# Patient Record
Sex: Female | Born: 1985 | Race: Black or African American | Hispanic: No | Marital: Single | Smoking: Never smoker
Health system: Southern US, Community
[De-identification: ages and names within clinical notes are randomized; demographics above are authoritative.]

## PROBLEM LIST (undated history)

## (undated) DIAGNOSIS — T7840XA Allergy, unspecified, initial encounter: Secondary | ICD-10-CM

## (undated) HISTORY — DX: Allergy, unspecified, initial encounter: T78.40XA

---

## 2014-04-07 ENCOUNTER — Encounter (HOSPITAL_COMMUNITY): Payer: Self-pay

## 2014-04-07 ENCOUNTER — Emergency Department (HOSPITAL_COMMUNITY)
Admission: EM | Admit: 2014-04-07 | Discharge: 2014-04-07 | Disposition: A | Discharge status: Home / Self Care | Payer: 59 | Attending: Emergency Medicine | Admitting: Emergency Medicine

## 2014-04-07 ENCOUNTER — Emergency Department (HOSPITAL_COMMUNITY): Payer: 59

## 2014-04-07 DIAGNOSIS — Y9389 Activity, other specified: Secondary | ICD-10-CM | POA: Insufficient documentation

## 2014-04-07 DIAGNOSIS — Y998 Other external cause status: Secondary | ICD-10-CM | POA: Insufficient documentation

## 2014-04-07 DIAGNOSIS — Y9289 Other specified places as the place of occurrence of the external cause: Secondary | ICD-10-CM | POA: Insufficient documentation

## 2014-04-07 DIAGNOSIS — Z3202 Encounter for pregnancy test, result negative: Secondary | ICD-10-CM | POA: Insufficient documentation

## 2014-04-07 DIAGNOSIS — S29012A Strain of muscle and tendon of back wall of thorax, initial encounter: Secondary | ICD-10-CM | POA: Diagnosis not present

## 2014-04-07 DIAGNOSIS — M549 Dorsalgia, unspecified: Secondary | ICD-10-CM | POA: Diagnosis present

## 2014-04-07 DIAGNOSIS — X58XXXA Exposure to other specified factors, initial encounter: Secondary | ICD-10-CM | POA: Insufficient documentation

## 2014-04-07 DIAGNOSIS — M545 Low back pain: Secondary | ICD-10-CM | POA: Insufficient documentation

## 2014-04-07 DIAGNOSIS — R079 Chest pain, unspecified: Secondary | ICD-10-CM | POA: Insufficient documentation

## 2014-04-07 DIAGNOSIS — S39012A Strain of muscle, fascia and tendon of lower back, initial encounter: Secondary | ICD-10-CM

## 2014-04-07 LAB — URINALYSIS, ROUTINE W REFLEX MICROSCOPIC
Bilirubin Urine: NEGATIVE
GLUCOSE, UA: NEGATIVE mg/dL
Hgb urine dipstick: NEGATIVE
Ketones, ur: NEGATIVE mg/dL
Nitrite: NEGATIVE
Protein, ur: NEGATIVE mg/dL
SPECIFIC GRAVITY, URINE: 1.022 (ref 1.005–1.030)
Urobilinogen, UA: 1 mg/dL (ref 0.0–1.0)
pH: 6.5 (ref 5.0–8.0)

## 2014-04-07 LAB — CBC WITH DIFFERENTIAL/PLATELET
BASOS ABS: 0 10*3/uL (ref 0.0–0.1)
BASOS PCT: 0 % (ref 0–1)
Eosinophils Absolute: 0.4 10*3/uL (ref 0.0–0.7)
Eosinophils Relative: 5 % (ref 0–5)
HEMATOCRIT: 39.7 % (ref 36.0–46.0)
Hemoglobin: 12.4 g/dL (ref 12.0–15.0)
LYMPHS PCT: 38 % (ref 12–46)
Lymphs Abs: 2.8 10*3/uL (ref 0.7–4.0)
MCH: 26.4 pg (ref 26.0–34.0)
MCHC: 31.2 g/dL (ref 30.0–36.0)
MCV: 84.5 fL (ref 78.0–100.0)
Monocytes Absolute: 0.4 10*3/uL (ref 0.1–1.0)
Monocytes Relative: 5 % (ref 3–12)
NEUTROS PCT: 52 % (ref 43–77)
Neutro Abs: 3.9 10*3/uL (ref 1.7–7.7)
PLATELETS: 209 10*3/uL (ref 150–400)
RBC: 4.7 MIL/uL (ref 3.87–5.11)
RDW: 12.8 % (ref 11.5–15.5)
WBC: 7.5 10*3/uL (ref 4.0–10.5)

## 2014-04-07 LAB — I-STAT TROPONIN, ED: Troponin i, poc: 0 ng/mL (ref 0.00–0.08)

## 2014-04-07 LAB — COMPREHENSIVE METABOLIC PANEL
ALBUMIN: 4.4 g/dL (ref 3.5–5.2)
ALT: 25 U/L (ref 0–35)
AST: 23 U/L (ref 0–37)
Alkaline Phosphatase: 90 U/L (ref 39–117)
Anion gap: 11 (ref 5–15)
BILIRUBIN TOTAL: 0.2 mg/dL — AB (ref 0.3–1.2)
BUN: 10 mg/dL (ref 6–23)
CHLORIDE: 104 meq/L (ref 96–112)
CO2: 20 mmol/L (ref 19–32)
Calcium: 9.3 mg/dL (ref 8.4–10.5)
Creatinine, Ser: 0.7 mg/dL (ref 0.50–1.10)
GFR calc Af Amer: >90 mL/min (ref >90)
Glucose, Bld: 84 mg/dL (ref 70–99)
Potassium: 3.7 mmol/L (ref 3.5–5.1)
SODIUM: 135 mmol/L (ref 135–145)
Total Protein: 7.7 g/dL (ref 6.0–8.3)

## 2014-04-07 LAB — URINE MICROSCOPIC-ADD ON

## 2014-04-07 LAB — PREGNANCY, URINE: Preg Test, Ur: NEGATIVE

## 2014-04-07 LAB — D-DIMER, QUANTITATIVE: D-Dimer, Quant: <0.27 ug/mL-FEU (ref 0.00–0.48)

## 2014-04-07 MED ORDER — IBUPROFEN 800 MG PO TABS
800.0000 mg | ORAL_TABLET | Freq: Once | ORAL | Status: DC
  Filled 2014-04-07: qty 1

## 2014-04-07 NOTE — ED Provider Notes (Signed)
CSN: [id number]     Arrival date & time 04/07/14  [id number] History   First MD Initiated Contact with Patient 04/07/14 [phone]     Chief Complaint  Patient presents with  . Back Pain     (Consider location/radiation/quality/duration/timing/severity/associated sxs/prior Treatment) The history is provided by the patient.  Pamela Davidson is a 28 y.o. female here with L sided chest and back pain. 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 She began to have left-sided chest pain and back pain. It is worse when she takes a deep breath. Not worse with exertion. Denies any cough or fevers. Denies any flank pain. No history of PE or DVT and no leg swelling. She states that her grandmother died of heart disease at old age. No personal hx of CAD.    History reviewed. No pertinent past medical history. History reviewed. No pertinent past surgical history. No family history on file. History  Substance Use Topics  . Smoking status: Never Smoker   . Smokeless tobacco: Not on file  . Alcohol Use: No   OB History    No data available     Review of Systems  Cardiovascular: Positive for chest pain.  All other systems reviewed and are negative.     Allergies  Review of patient's allergies indicates no known allergies.  Home Medications   Prior to Admission medications   Not on File   BP 119/71 mmHg  Pulse 75  Temp(Src) 97.9 F (36.6 C) (Oral)  Resp 12  Ht 5\' 6"  (1.676 m)  Wt 146 lb (66.225 kg)  BMI 23.58 kg/m2  SpO2 100%  LMP 02/13/2014 Physical Exam  Constitutional: She is oriented to person, place, and time. She appears well-developed and well-nourished.  HENT:  Head: Normocephalic.  Mouth/Throat: Oropharynx is clear and moist.  Eyes: Conjunctivae and EOM are normal. Pupils are equal, round, and reactive to light.  Neck: Normal range of motion. Neck supple.  Cardiovascular: Normal rate, regular rhythm and normal heart sounds.   Pulmonary/Chest: Effort normal and breath sounds  normal. No respiratory distress. She has no wheezes. She has no rales.  Point tenderness L lower posterior ribs, no obvious deformity. No crackles   Abdominal: Soft. Bowel sounds are normal. She exhibits no distension. There is no tenderness. There is no rebound.  Musculoskeletal: Normal range of motion. She exhibits no edema or tenderness.  Neurological: She is alert and oriented to person, place, and time. No cranial nerve deficit. Coordination normal.  Skin: Skin is warm and dry.  Psychiatric: She has a normal mood and affect. Her behavior is normal. Judgment and thought content normal.  Nursing note and vitals reviewed.   ED Course  Procedures (including critical care time) Labs Review Labs Reviewed  COMPREHENSIVE METABOLIC PANEL - Abnormal; Notable for the following:    Total Bilirubin 0.2 (*)    All other components within normal limits  URINALYSIS, ROUTINE W REFLEX MICROSCOPIC - Abnormal; Notable for the following:    APPearance HAZY (*)    Leukocytes, UA SMALL (*)    All other components within normal limits  URINE MICROSCOPIC-ADD ON - Abnormal; Notable for the following:    Squamous Epithelial / LPF MANY (*)    Bacteria, UA MANY (*)    All other components within normal limits  URINE CULTURE  CBC WITH DIFFERENTIAL  D-DIMER, QUANTITATIVE  PREGNANCY, URINE  I-STAT TROPOININ, ED    Imaging Review Dg Chest 2 View  04/07/2014   CLINICAL DATA:  Sharp posterior  chest pain and shortness of breath.  EXAM: CHEST - 2 VIEW  COMPARISON:  None  FINDINGS: The heart size and mediastinal contours are within normal limits. There is no evidence of pulmonary edema, consolidation, pneumothorax, nodule or pleural fluid. The visualized skeletal structures are unremarkable.  IMPRESSION: No active disease.   Electronically Signed   By: Irish LackGlenn  Yamagata M.D.   On: 04/07/2014 07:51     EKG Interpretation   Date/Time:  Tuesday April 07 2014 06:59:57 EST Ventricular Rate:  75 PR Interval:   147 QRS Duration: 103 QT Interval:  381 QTC Calculation: 425 R Axis:   86 Text Interpretation:  Sinus rhythm Borderline Q waves in inferior leads No  previous ECGs available Confirmed by YAO  MD, DAVID ([id number]) on 04/07/2014  7:31:31 AM      MDM   Final diagnoses:  Chest pain   Pamela Davidson is a 28 y.o. female here with L sided chest pain. Consider muscle strain vs PE, less likely to be ACS. Low risk for PE so will get d-dimer. Will get labs and CXR.   8:46 AM D-dimer neg. Labs and CXR unremarkable. UA showed no blood. Likely muscle strain. Recommend prn motrin.    Richardean Canalavid H Yao, MD 04/07/14 [phone]

## 2014-04-07 NOTE — Discharge Instructions (Signed)
Take motrin 600 mg every 6 hrs for pain.  Avoid heavy lifting.   Follow up with your doctor.   Return to ER if you have worse pain, chest pain, shortness of breath.

## 2014-04-07 NOTE — ED Notes (Signed)
Rt. Lower posterior mid back pain began last night while pt. Was working.  Denies any cough or cold symptoms,  Denies any dizziness or sob.   Pt. Is alert and oriented X4

## 2014-04-08 LAB — URINE CULTURE

## 2014-05-13 ENCOUNTER — Ambulatory Visit (INDEPENDENT_AMBULATORY_CARE_PROVIDER_SITE_OTHER): Payer: 59 | Admitting: Physician Assistant

## 2014-05-13 VITALS — BP 98/70 | HR 79 | Temp 99.2°F | Resp 16 | Ht 64.0 in | Wt 139.4 lb

## 2014-05-13 DIAGNOSIS — J309 Allergic rhinitis, unspecified: Secondary | ICD-10-CM

## 2014-05-13 DIAGNOSIS — J0110 Acute frontal sinusitis, unspecified: Secondary | ICD-10-CM

## 2014-05-13 MED ORDER — GUAIFENESIN ER 1200 MG PO TB12: 1.0000 | ORAL_TABLET | Freq: Two times a day (BID) | ORAL | Status: DC | PRN

## 2014-05-13 MED ORDER — AMOXICILLIN-POT CLAVULANATE 875-125 MG PO TABS: 1.0000 | ORAL_TABLET | Freq: Two times a day (BID) | ORAL | Status: AC

## 2014-05-13 MED ORDER — IPRATROPIUM BROMIDE 0.03 % NA SOLN: 2.0000 | Freq: Two times a day (BID) | NASAL | Status: DC

## 2014-05-13 MED ORDER — FEXOFENADINE HCL 180 MG PO TABS: 180.0000 mg | ORAL_TABLET | Freq: Every day | ORAL | Status: DC

## 2014-05-13 NOTE — Patient Instructions (Signed)
Get plenty of rest and drink at least 64 ounces of water daily. 

## 2014-05-13 NOTE — Progress Notes (Signed)
Subjective:    Patient ID: Pamela Davidson, female    DOB: 03-13-1986, 29 y.o.   MRN: [id number]   PCP: No PCP Per Patient  Chief Complaint  Patient presents with  . Sinusitis    x 2 weeks    No Known Allergies  There are no active problems to display for this patient.   Prior to Admission medications   Not on File    Medical, Surgical, Family and Social History reviewed and updated.  HPI  "It's my sinuses.' "I don't know if they are infected or on the verge of being infection. I have this constant pain and pressure right here (points to the bridge of the nose). And I can feel it dripping in my throat."  No sore throat.  Ear pressure sometimes.  No pain or popping.  A little bit of cough. No fever, chills. Nausea. No vomiting or diarrhea.  Symptoms present x 2 weeks. [employer] brand of allergy medicine without benefit. She has perennial allergies, previously used prescription Allegra. OTC product isn't helping. Not sure what dose she's using.  Review of Systems As above.    Objective:   Physical Exam  Constitutional: She is oriented to person, place, and time. Vital signs are normal. She appears well-developed and well-nourished. She is active and cooperative. No distress.  BP 98/70 mmHg  Pulse 79  Temp(Src) 99.2 F (37.3 C) (Oral)  Resp 16  Ht  (1.626 m)  Wt 139 lb 6 oz (63.22 kg)  BMI 23.91 kg/m2  SpO2 99%  LMP 05/06/2014   HENT:  Head: Normocephalic and atraumatic.  Right Ear: Hearing, tympanic membrane, external ear and ear canal normal.  Left Ear: Hearing, tympanic membrane, external ear and ear canal normal.  Nose: Mucosal edema present.  No foreign bodies. Right sinus exhibits maxillary sinus tenderness and frontal sinus tenderness. Left sinus exhibits maxillary sinus tenderness and frontal sinus tenderness.  Mouth/Throat: Uvula is midline, oropharynx is clear and moist and mucous membranes are normal. No uvula swelling. No oropharyngeal  exudate.  Eyes: Conjunctivae and EOM are normal. Pupils are equal, round, and reactive to light. Right eye exhibits no discharge. Left eye exhibits no discharge. No scleral icterus.  Neck: Trachea normal, normal range of motion and full passive range of motion without pain. Neck supple. No thyroid mass and no thyromegaly present.  Cardiovascular: Normal rate, regular rhythm and normal heart sounds.   Pulmonary/Chest: Effort normal and breath sounds normal.  Lymphadenopathy:       Head (right side): No submandibular, no tonsillar, no preauricular, no posterior auricular and no occipital adenopathy present.       Head (left side): No submandibular, no tonsillar, no preauricular and no occipital adenopathy present.    She has no cervical adenopathy.       Right: No supraclavicular adenopathy present.       Left: No supraclavicular adenopathy present.  Neurological: She is alert and oriented to person, place, and time. She has normal strength. No cranial nerve deficit or sensory deficit.  Skin: Skin is warm, dry and intact. No rash noted.  Psychiatric: She has a normal mood and affect. Her speech is normal and behavior is normal.          Assessment & Plan:  1. Acute frontal sinusitis, recurrence not specified Supportive care.  Anticipatory guidance.  RTC if symptoms worsen/persist. - amoxicillin-clavulanate (AUGMENTIN) 875-125 MG per tablet; Take 1 tablet by mouth 2 (two) times daily.  Dispense: 20 tablet;  Refill: 0 - ipratropium (ATROVENT) 0.03 % nasal spray; Place 2 sprays into both nostrils 2 (two) times daily.  Dispense: 30 mL; Refill: 0 - Guaifenesin (MUCINEX MAXIMUM STRENGTH) 1200 MG TB12; Take 1 tablet (1,200 mg total) by mouth every 12 (twelve) hours as needed.  Dispense: 14 tablet; Refill: 1  2. Allergic rhinitis, unspecified allergic rhinitis type Refill fexofenadine. - fexofenadine (ALLEGRA) 180 MG tablet; Take 1 tablet (180 mg total) by mouth daily.  Dispense: 90 tablet; Refill:  3   Fernande Brashelle S. Chelci [place], PA-C Physician Assistant-Certified [facility]

## 2014-12-15 ENCOUNTER — Ambulatory Visit: Payer: 59 | Admitting: Physician Assistant

## 2014-12-22 ENCOUNTER — Encounter: Payer: Self-pay | Admitting: Family Medicine

## 2014-12-22 ENCOUNTER — Ambulatory Visit (INDEPENDENT_AMBULATORY_CARE_PROVIDER_SITE_OTHER): Payer: 59 | Admitting: Family Medicine

## 2014-12-22 VITALS — BP 120/86 | HR 78 | Temp 98.8°F | Resp 16 | Ht 64.75 in | Wt 144.6 lb

## 2014-12-22 DIAGNOSIS — F411 Generalized anxiety disorder: Secondary | ICD-10-CM | POA: Diagnosis not present

## 2014-12-22 DIAGNOSIS — R002 Palpitations: Secondary | ICD-10-CM | POA: Diagnosis not present

## 2014-12-22 DIAGNOSIS — M25511 Pain in right shoulder: Secondary | ICD-10-CM | POA: Diagnosis not present

## 2014-12-22 DIAGNOSIS — Z23 Encounter for immunization: Secondary | ICD-10-CM | POA: Diagnosis not present

## 2014-12-22 LAB — COMPREHENSIVE METABOLIC PANEL
ALT: 24 U/L (ref 6–29)
AST: 27 U/L (ref 10–30)
Albumin: 4.4 g/dL (ref 3.6–5.1)
Alkaline Phosphatase: 64 U/L (ref 33–115)
BUN: 8 mg/dL (ref 7–25)
CALCIUM: 9.2 mg/dL (ref 8.6–10.2)
CHLORIDE: 104 mmol/L (ref 98–110)
CO2: 25 mmol/L (ref 20–31)
Creat: 0.6 mg/dL (ref 0.50–1.10)
GLUCOSE: 88 mg/dL (ref 65–99)
POTASSIUM: 4.7 mmol/L (ref 3.5–5.3)
Sodium: 138 mmol/L (ref 135–146)
Total Bilirubin: 0.5 mg/dL (ref 0.2–1.2)
Total Protein: 7.5 g/dL (ref 6.1–8.1)

## 2014-12-22 LAB — TSH: TSH: 1.248 u[IU]/mL (ref 0.350–4.500)

## 2014-12-22 MED ORDER — CYCLOBENZAPRINE HCL 10 MG PO TABS: 10.0000 mg | ORAL_TABLET | Freq: Every evening | ORAL | Status: DC | PRN
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
## 2014-12-22 NOTE — Patient Instructions (Signed)
Call today for appointment with a psychiatrist-  [facility]- [phone] Behavioral Health- [phone] (Dr. Lolly Mustache or someone else there)  Employee [phone]  Generalized Anxiety Disorder Generalized anxiety disorder (GAD) is a mental disorder. It interferes with life functions, including relationships, work, and school. GAD is different from normal anxiety, which everyone experiences at some point in their lives in response to specific life events and activities. Normal anxiety actually helps [place] prepare for and get through these life events and activities. Normal anxiety goes away after the event or activity is over.  GAD causes anxiety that is not necessarily related to specific events or activities. It also causes excess anxiety in proportion to specific events or activities. The anxiety associated with GAD is also difficult to control. GAD can vary from mild to severe. People with severe GAD can have intense waves of anxiety with physical symptoms (panic attacks).  SYMPTOMS The anxiety and worry associated with GAD are difficult to control. This anxiety and worry are related to many life events and activities and also occur more days than not for 6 months or longer. People with GAD also have three or more of the following symptoms (one or more in children):  Restlessness.   Fatigue.  Difficulty concentrating.   Irritability.  Muscle tension.  Difficulty sleeping or unsatisfying sleep. DIAGNOSIS GAD is diagnosed through an assessment by your health care provider. Your health care provider will ask you questions aboutyour mood,physical symptoms, and events in your life. Your health care provider may ask you about your medical history and use of alcohol or drugs, including prescription medicines. Your health care provider may also do a physical exam and blood tests. Certain medical conditions and the use of certain substances can cause symptoms similar to  those associated with GAD. Your health care provider may refer you to a mental health specialist for further evaluation. TREATMENT The following therapies are usually used to treat GAD:   Medication. Antidepressant medication usually is prescribed for long-term daily control. Antianxiety medicines may be added in severe cases, especially when panic attacks occur.   Talk therapy (psychotherapy). Certain types of talk therapy can be helpful in treating GAD by providing support, education, and guidance. A form of talk therapy called cognitive behavioral therapy can teach you healthy ways to think about and react to daily life events and activities.  Stress managementtechniques. These include yoga, meditation, and exercise and can be very helpful when they are practiced regularly. A mental health specialist can help determine which treatment is best for you. Some people see improvement with one therapy. However, other people require a combination of therapies. Document Released: 07/22/2012 Document Revised: 08/11/2013 Document Reviewed: 07/22/2012 [facility] Patient Information 2015 [place], [place]. This information is not intended to replace advice given to you by your health care provider. Make sure you discuss any questions you have with your health care provider.

## 2014-12-23 ENCOUNTER — Encounter: Payer: Self-pay | Admitting: Family Medicine

## 2014-12-23 LAB — CBC
HCT: 39 % (ref 36.0–46.0)
Hemoglobin: 13 g/dL (ref 12.0–15.0)
MCH: 27.7 pg (ref 26.0–34.0)
MCHC: 33.3 g/dL (ref 30.0–36.0)
MCV: 83.2 fL (ref 78.0–100.0)
MPV: 11.8 fL (ref 8.6–12.4)
PLATELETS: 200 10*3/uL (ref 150–400)
RBC: 4.69 MIL/uL (ref 3.87–5.11)
RDW: 13.9 % (ref 11.5–15.5)
WBC: 6.2 10*3/uL (ref 4.0–10.5)

## 2014-12-25 ENCOUNTER — Other Ambulatory Visit: Payer: Self-pay | Admitting: Family Medicine

## 2014-12-25 ENCOUNTER — Telehealth: Payer: Self-pay | Admitting: Family Medicine

## 2014-12-25 DIAGNOSIS — F411 Generalized anxiety disorder: Secondary | ICD-10-CM

## 2014-12-25 MED ORDER — GABAPENTIN 100 MG PO CAPS: ORAL_CAPSULE | ORAL | Status: DC

## 2014-12-25 MED ORDER — HYDROXYZINE HCL 25 MG PO TABS: ORAL_TABLET | ORAL | Status: DC

## 2014-12-25 NOTE — Telephone Encounter (Signed)
Received Mychart email from patient who reports that she has an appointment with Dr. Gilmore Laroche 11/8 but she is having difficulty managing her anxiety and is not sure she can wait until then. I called her an she said that she is sleeping better with flexeril which was prescribed for her shoulder pain, but that her anxiety has felt very overwhelming the last several days and she has had difficulty getting through her work day.  I discussed starting gabapentin and hydroxyzine and sent those to her pharmacy. I have asked the scheduling pool to make her a follow up appointment with me in 2 weeks.

## 2015-01-11 ENCOUNTER — Telehealth: Payer: Self-pay

## 2015-01-11 ENCOUNTER — Ambulatory Visit: Payer: Self-pay | Admitting: Family Medicine

## 2015-01-11 NOTE — Telephone Encounter (Signed)
LMVM patient missed appt with Deboraha Sprang this morning at 8:00.  Please call back to reschedule.

## 2015-02-12 ENCOUNTER — Encounter: Payer: Self-pay | Admitting: Physician Assistant

## 2015-02-12 ENCOUNTER — Ambulatory Visit (INDEPENDENT_AMBULATORY_CARE_PROVIDER_SITE_OTHER): Payer: 59 | Admitting: Physician Assistant

## 2015-02-12 VITALS — BP 130/86 | HR 98 | Temp 98.0°F | Resp 16 | Ht 64.5 in | Wt 141.0 lb

## 2015-02-12 DIAGNOSIS — M542 Cervicalgia: Secondary | ICD-10-CM | POA: Diagnosis not present

## 2015-02-12 DIAGNOSIS — G44209 Tension-type headache, unspecified, not intractable: Secondary | ICD-10-CM | POA: Diagnosis not present

## 2015-02-12 LAB — COMPLETE METABOLIC PANEL WITH GFR
ALT: 12 U/L (ref 6–29)
AST: 14 U/L (ref 10–30)
Albumin: 4.7 g/dL (ref 3.6–5.1)
Alkaline Phosphatase: 61 U/L (ref 33–115)
BUN: 13 mg/dL (ref 7–25)
CALCIUM: 9.5 mg/dL (ref 8.6–10.2)
CO2: 25 mmol/L (ref 20–31)
CREATININE: 0.66 mg/dL (ref 0.50–1.10)
Chloride: 102 mmol/L (ref 98–110)
GFR, Est African American: >89 mL/min (ref >=60)
GFR, Est Non African American: >89 mL/min (ref >=60)
Glucose, Bld: 127 mg/dL — ABNORMAL HIGH (ref 65–99)
POTASSIUM: 3.8 mmol/L (ref 3.5–5.3)
Sodium: 138 mmol/L (ref 135–146)
Total Bilirubin: 0.6 mg/dL (ref 0.2–1.2)
Total Protein: 7.9 g/dL (ref 6.1–8.1)

## 2015-02-12 LAB — CBC WITH DIFFERENTIAL/PLATELET
Basophils Absolute: 0 10*3/uL (ref 0.0–0.1)
Basophils Relative: 0 % (ref 0–1)
Eosinophils Absolute: 0.2 10*3/uL (ref 0.0–0.7)
Eosinophils Relative: 3 % (ref 0–5)
HEMATOCRIT: 39.1 % (ref 36.0–46.0)
HEMOGLOBIN: 13.2 g/dL (ref 12.0–15.0)
LYMPHS PCT: 34 % (ref 12–46)
Lymphs Abs: 2.2 10*3/uL (ref 0.7–4.0)
MCH: 27.7 pg (ref 26.0–34.0)
MCHC: 33.8 g/dL (ref 30.0–36.0)
MCV: 82 fL (ref 78.0–100.0)
MONO ABS: 0.3 10*3/uL (ref 0.1–1.0)
MPV: 11.1 fL (ref 8.6–12.4)
Monocytes Relative: 5 % (ref 3–12)
Neutro Abs: 3.7 10*3/uL (ref 1.7–7.7)
Neutrophils Relative %: 58 % (ref 43–77)
Platelets: 222 10*3/uL (ref 150–400)
RBC: 4.77 MIL/uL (ref 3.87–5.11)
RDW: 13.2 % (ref 11.5–15.5)
WBC: 6.4 10*3/uL (ref 4.0–10.5)

## 2015-02-12 LAB — POCT URINALYSIS DIP (MANUAL ENTRY)
BILIRUBIN UA: NEGATIVE
GLUCOSE UA: NEGATIVE
Nitrite, UA: NEGATIVE
Protein Ur, POC: NEGATIVE
SPEC GRAV UA: 1.01
Urobilinogen, UA: 0.2
pH, UA: 6

## 2015-02-12 LAB — POCT URINE PREGNANCY: Preg Test, Ur: NEGATIVE

## 2015-02-12 LAB — TSH: TSH: 0.591 u[IU]/mL (ref 0.350–4.500)

## 2015-02-12 MED ORDER — KETOROLAC TROMETHAMINE 60 MG/2ML IM SOLN
60.0000 mg | Freq: Once | INTRAMUSCULAR | Status: AC
  Administered 2015-02-12: 60 mg via INTRAMUSCULAR

## 2015-02-12 MED ORDER — ACETAMINOPHEN 500 MG PO TABS: 500.0000 mg | ORAL_TABLET | Freq: Three times a day (TID) | ORAL | Status: AC | PRN

## 2015-02-12 MED ORDER — NAPROXEN 500 MG PO TABS: 500.0000 mg | ORAL_TABLET | Freq: Two times a day (BID) | ORAL | Status: DC

## 2015-02-12 MED ORDER — CYCLOBENZAPRINE HCL 10 MG PO TABS: 10.0000 mg | ORAL_TABLET | Freq: Three times a day (TID) | ORAL | Status: DC | PRN

## 2015-02-12 NOTE — Progress Notes (Signed)
02/12/2015 at 4:14 PM  Pamela Davidson / DOB: 01/14/1986 / MRN: [id number]  The patient  does not have a problem list on file.  SUBJECTIVE  Pamela Davidson is a 29 y.o. female who complains of bilateral frontal and temporal throbbing HA that started 5 days ago and is worsening. Associates right sided trapezius pain and denies neck injury. Denies changes in vision, paresthesia, change in sensation, weakness and facial pain.  Has tried 800 mg of Ibuprofen without relief, has not taken anything today.  She is sexually active and thinks she may be pregnant.  No history of HTN.   She  has a past medical history of Allergy.    Medications reviewed and updated by myself where necessary, and exist elsewhere in the encounter.   Pamela Davidson has No Known Allergies. She  reports that she has never smoked. She has never used smokeless tobacco. She reports that she drinks alcohol. She reports that she does not use illicit drugs. She  reports that she currently engages in sexual activity and has had female partners. She reports using the following method of birth control/protection: Condom. The patient  has no past surgical history on file.  Her family history includes Cancer in her paternal grandfather; Diabetes in her paternal grandmother; Hyperlipidemia in her maternal grandfather; Hypertension in her brother, maternal grandfather, mother, and sister; Stroke in her maternal grandfather and maternal grandmother.  Review of Systems  Constitutional: Negative for fever and chills.  Respiratory: Negative for cough.   Cardiovascular: Negative for chest pain.  Gastrointestinal: Positive for nausea. Negative for vomiting and abdominal pain.  Skin: Negative for rash.  Neurological: Negative for dizziness and headaches.    OBJECTIVE  Her  height is 5' 4.5" (1.638 m) and weight is 141 lb (63.957 kg). Her temperature is 98 F (36.7 C). Her blood pressure is 130/86 and her pulse is 98. Her respiration is 16.  The  patient's body mass index is 23.84 kg/(m^2).  Physical Exam  Constitutional: She is oriented to person, place, and time. She appears well-developed and well-nourished. No distress.  HENT:  Right Ear: External ear normal.  Left Ear: External ear normal.  Nose: Nose normal.  Mouth/Throat: No oropharyngeal exudate.  Eyes: Conjunctivae and EOM are normal. Pupils are equal, round, and reactive to light.  Neck: Normal range of motion. No thyromegaly present.    Cardiovascular: Normal rate, regular rhythm, normal heart sounds and intact distal pulses.   Respiratory: Effort normal and breath sounds normal.  GI: Soft. Bowel sounds are normal.  Musculoskeletal: Normal range of motion.  Lymphadenopathy:    She has no cervical adenopathy.  Neurological: She is alert and oriented to person, place, and time. She has normal reflexes. She displays no atrophy and no tremor. No cranial nerve deficit or sensory deficit. She exhibits normal muscle tone. She displays no seizure activity. Coordination and gait normal. GCS eye subscore is 4. GCS verbal subscore is 5. GCS motor subscore is 6.  Reflex Scores:      Tricep reflexes are 2+ on the right side and 2+ on the left side.      Bicep reflexes are 2+ on the right side and 2+ on the left side.      Brachioradialis reflexes are 2+ on the right side and 2+ on the left side.      Patellar reflexes are 2+ on the right side and 2+ on the left side.      Achilles reflexes are 2+ on  the right side and 2+ on the left side. Skin: Skin is warm and dry. She is not diaphoretic.  Psychiatric: She has a normal mood and affect. Her behavior is normal. Judgment and thought content normal.    Results for orders placed or performed in visit on 02/12/15 (from the past 24 hour(s))  POCT urinalysis dipstick     Status: Abnormal   Collection Time: 02/12/15  3:50 PM  Result Value Ref Range   Color, UA yellow yellow   Clarity, UA clear clear   Glucose, UA negative negative     Bilirubin, UA negative negative   Ketones, POC UA trace (5) (A) negative   Spec Grav, UA 1.010    Blood, UA small (A) negative   pH, UA 6.0    Protein Ur, POC negative negative   Urobilinogen, UA 0.2    Nitrite, UA Negative Negative   Leukocytes, UA Trace (A) Negative  POCT urine pregnancy     Status: None   Collection Time: 02/12/15  3:50 PM  Result Value Ref Range   Preg Test, Ur Negative Negative    ASSESSMENT & PLAN  Pamela Davidson was seen today for nausea, emesis and headache.  Diagnoses and all orders for this visit:  Tension-type headache, not intractable, unspecified chronicity pattern: 50% improved with 60 IM toradol.  Patient desiring to go home and rest after administration.  This HA is most likely secondary to problem two, which certainly makes sense given the duration of the HA.   Will rule out other etiologies and will treat with Naprosyn, Tylenol and Flexeril.  Will have low threshold for neurology referral if the plan does not improve her symptoms.   -     ketorolac (TORADOL) injection 60 mg; Inject 2 mLs (60 mg total) into the muscle once. -     POCT urinalysis dipstick -     POCT urine pregnancy -     CBC with Differential/Platelet -     COMPLETE METABOLIC PANEL WITH GFR -     TSH -     naproxen (NAPROSYN) 500 MG tablet; Take 1 tablet (500 mg total) by mouth 2 (two) times daily with a meal. -     acetaminophen (TYLENOL) 500 MG tablet; Take 1 tablet (500 mg total) by mouth every 8 (eight) hours as needed for moderate pain. -     POCT SEDIMENTATION RATE   Tenderness of neck -     cyclobenzaprine (FLEXERIL) 10 MG tablet; Take 1 tablet (10 mg total) by mouth 3 (three) times daily as needed for muscle spasms.    The patient was advised to call or come back to clinic if she does not see an improvement in symptoms, or worsens with the above plan.   Pamela Davidson, MHS, PA-C [facility] 02/12/2015 4:14 PM

## 2015-02-14 ENCOUNTER — Telehealth: Payer: Self-pay | Admitting: *Deleted

## 2015-02-14 NOTE — Telephone Encounter (Signed)
Pt sed rate was not done.  I was notified on Sunday about this.  It was not communicated that this was to be done on Saturday morning.  Called [facility] to try and add on but they said they could not do it because it had to be done with in 24 hours.  Tried to call pt to come back in for [place].  Please advise what you would like for us to do next.  Our apologies.  Please route back to clinical message pool.

## 2015-02-15 ENCOUNTER — Telehealth: Payer: Self-pay

## 2015-02-15 NOTE — Telephone Encounter (Signed)
Pt states she is still really congested and was told by Deliah BostonMichael Clark to call back if no better and he would call her something else in. Please call [phone]     [facility]

## 2015-02-15 NOTE — Telephone Encounter (Signed)
Advise patient to pick up Zyrtec-D 5-120 and take in the morning only.  This is over the counter, but sold from behind the counter and she will need to sign. Deliah BostonMichael Clark, MS, PA-C   6:00 PM, 02/15/2015

## 2015-02-16 ENCOUNTER — Ambulatory Visit (HOSPITAL_COMMUNITY): Payer: 59 | Admitting: Psychiatry

## 2015-02-16 NOTE — Telephone Encounter (Signed)
Left detailed message letting pt know. 

## 2015-05-05 ENCOUNTER — Ambulatory Visit (INDEPENDENT_AMBULATORY_CARE_PROVIDER_SITE_OTHER): Payer: 59 | Admitting: Family Medicine

## 2015-05-05 VITALS — BP 100/70 | HR 86 | Temp 98.8°F | Resp 16 | Ht 64.25 in | Wt 138.0 lb

## 2015-05-05 DIAGNOSIS — K0889 Other specified disorders of teeth and supporting structures: Secondary | ICD-10-CM

## 2015-05-05 NOTE — Progress Notes (Signed)
   Subjective:    Patient ID: Pamela Davidson, female    DOB: [birth year], 30 y.o.   MRN: [id number]  HPI This is a pleasant 30 yo female who presents today with braces pain. She had her braces placed yesterday in the morning. She felt pain as soon as her braces were applied. 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 She was unable to sleep last night due to pain. Pain worse with talking. She took approximately 10 tablets of ibuprofen 200 mg over the last 24 hours without relief. Pain is 10/10 and she has accompanying headache. No fever or chills, no other symptoms.   She was seen by me 9/16 with some anxiety and depression. She did not keep her follow up. She reports that she is doing well emotionally now and things are going well.   Past Medical History  Diagnosis Date  . Allergy    No past surgical history on file. Family History  Problem Relation Age of Onset  . Hypertension Mother   . Hypertension Sister   . Hypertension Brother   . Stroke Maternal Grandmother   . Hyperlipidemia Maternal Grandfather   . Hypertension Maternal Grandfather   . Stroke Maternal Grandfather   . Diabetes Paternal Grandmother   . Cancer Paternal Grandfather    Social History  Substance Use Topics  . Smoking status: Never Smoker   . Smokeless tobacco: Never Used  . Alcohol Use: 0.0 oz/week    0 Standard drinks or equivalent per week     Comment: "maybe once a week, maybe"    Review of Systems Per HPI     Objective:   Physical Exam  Constitutional: She is oriented to person, place, and time. She appears well-developed and well-nourished.  Appears to be in pain, grimacing.   HENT:  Head: Normocephalic and atraumatic.  Teeth and gums without obvious trauma.   Cardiovascular: Normal rate and regular rhythm.   Pulmonary/Chest: Effort normal.  Musculoskeletal: Normal range of motion.  Neurological: She is alert and oriented to person, place, and time.  Skin: Skin is warm and dry.    Psychiatric: She has a normal mood and affect. Her behavior is normal. Judgment and thought content normal.  Vitals reviewed.  BP 100/70 mmHg  Pulse 86  Temp(Src) 98.8 F (37.1 C) (Oral)  Resp 16  Ht 5' 4.25" (1.632 m)  Wt 138 lb (62.596 kg)  BMI 23.50 kg/m2  SpO2 98%  LMP 04/26/2015     Assessment & Plan:  1. Pain in a tooth or teeth - discussed maximum safe use of NSAIDs - I called the patient's orthodontist who will see patient right away.  - follow up PRN Olean Ree, FNP-BC  [facility], [facility]  05/07/2015 5:04 PM

## 2015-05-05 NOTE — Patient Instructions (Signed)
Please go to Dr. Cameron Sprang office (this office is different from the one you were seen at yesterday)  [address] [employer]

## 2015-05-17 ENCOUNTER — Ambulatory Visit: Payer: 59 | Admitting: Licensed Clinical Social Worker

## 2015-05-19 ENCOUNTER — Ambulatory Visit: Payer: 59 | Admitting: Licensed Clinical Social Worker

## 2015-06-01 DIAGNOSIS — J0101 Acute recurrent maxillary sinusitis: Secondary | ICD-10-CM | POA: Diagnosis not present

## 2015-06-01 DIAGNOSIS — F419 Anxiety disorder, unspecified: Secondary | ICD-10-CM | POA: Diagnosis not present

## 2015-06-30 ENCOUNTER — Telehealth: Payer: 59 | Admitting: Family

## 2015-06-30 DIAGNOSIS — J019 Acute sinusitis, unspecified: Secondary | ICD-10-CM | POA: Diagnosis not present

## 2015-06-30 MED ORDER — AZITHROMYCIN 250 MG PO TABS
ORAL_TABLET | ORAL | Status: DC
Start: 2015-06-30 — End: 2015-07-06

## 2015-06-30 NOTE — Progress Notes (Signed)
We are sorry that you are not feeling well.  Here is how we plan to help!  Based on what you have shared with me it looks like you have sinusitis.  Sinusitis is inflammation and infection in the sinus cavities of the head.  Based on your presentation I believe you most likely have Acute Bacterial sinusitis.  This is an infection caused by bacteria and is treated with antibiotics.  I have prescribed Azithromyin 250 mg: two tables now and then one tablet daily for 4 additonal days  You may use an oral decongestant such as Mucinex D or if you have glaucoma or high blood pressure use plain Mucinex.  Saline nasal sprays help an can sefely be used as often as needed for congestion.  If you develop worsening sinus pain, fever or notice severe headache and vision changes, or if symptoms are not better after completion of antibiotic, please schedule an appointment with a health care provider.  Sinus infections are not as easily transmitted as other respiratory infection, however we still recommend that you avoid close contact with loved ones, especially the very young and elderly.  Remember to wash your hands thoroughly throughout the day as this is the number one way to prevent the spread of infection!  Home Care:  Only take medications as instructed by your medical team.  Complete the entire course of an antibiotic.  Do not take these medications with alcohol.  A steam or ultrasonic humidifier can help congestion.  You can place a towel over your head and breathe in the steam from hot water coming from a faucet.  Avoid close contacts especially the very young and the elderly.  Cover your mouth when you cough or sneeze.  Always remember to wash your hands.  Get Help Right Away If:  You develop worsening fever or sinus pain.  You develop a severe head ache or visual changes.  Your symptoms persist after you have completed your treatment plan.  Make sure you  Understand these  instructions.  Will watch your condition.  Will get help right away if you are not doing well or get worse.  Your e-visit answers were reviewed by a board certified advanced clinical practitioner to complete your personal care plan.  Depending on the condition, your plan could have included both over the counter or prescription medications.  If there is a problem please reply  once you have received a response from your provider.  Your safety is important to us.  If you have drug allergies check your prescription carefully.    You can use MyChart to ask questions about today's visit, request a non-urgent call back, or ask for a work or school excuse.  You will get an e-mail in the next two days asking about your experience.  I hope that your e-visit has been valuable and will speed your recovery. Thank you for using e-visits.        

## 2015-07-06 ENCOUNTER — Ambulatory Visit (INDEPENDENT_AMBULATORY_CARE_PROVIDER_SITE_OTHER): Payer: 59 | Admitting: Family Medicine

## 2015-07-06 VITALS — BP 110/74 | HR 94 | Temp 99.2°F | Resp 19 | Ht 65.0 in | Wt 135.0 lb

## 2015-07-06 DIAGNOSIS — R112 Nausea with vomiting, unspecified: Secondary | ICD-10-CM

## 2015-07-06 DIAGNOSIS — A09 Infectious gastroenteritis and colitis, unspecified: Secondary | ICD-10-CM | POA: Diagnosis not present

## 2015-07-06 MED ORDER — ONDANSETRON 8 MG PO TBDP: 8.0000 mg | ORAL_TABLET | Freq: Three times a day (TID) | ORAL | Status: AC | PRN

## 2015-07-06 MED ORDER — ONDANSETRON 4 MG PO TBDP
4.0000 mg | ORAL_TABLET | Freq: Once | ORAL | Status: AC
  Administered 2015-07-06: 4 mg via ORAL

## 2015-07-06 NOTE — Progress Notes (Signed)
Attempted IV access time three without success.  Benny LennertSarah Weber advised the attempt.  Deliah BostonMichael Tarik Teixeira, MS, PA-C 11:57 AM, 07/06/2015

## 2015-07-06 NOTE — Progress Notes (Addendum)
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 Last night about 8:00, she abruptly began feeling epigastric pain followed by nausea, vomiting, and diarrhea. She took some medicine from the gift shop for nausea but it did not help.  She's been having nausea, vomiting, and diarrhea all night, most recently at 9 AM this morning. She has not been able to keep anything down. She currently feels weak and somewhat dizzy when standing.  Patient has no underlying bowel problems.  Objective:BP 110/74 mmHg  Pulse 94  Temp(Src) 99.2 F (37.3 C) (Oral)  Resp 19  Ht 5' 5" (1.651 m)  Wt 135 lb (61.236 kg)  BMI 22.47 kg/m2  SpO2 96%  LMP 06/22/2015  Orthostatic VS for the past 24 hrs:  BP- Lying Pulse- Lying BP- Sitting Pulse- Sitting BP- Standing at 0 minutes Pulse- Standing at 0 minutes  07/06/15 1204 118/76 mmHg 90 123/82 mmHg 96 117/82 mmHg 98    HEENT: Unremarkable with no icterus Chest: Clear Heart: Regular no murmur, Abdomen: Soft nontender with active bowel sounds and no HSM Results for orders placed or performed in visit on 02/12/15  CBC with Differential/Platelet  Result Value Ref Range   WBC 6.4 4.0 - 10.5 K/uL   RBC 4.77 3.87 - 5.11 MIL/uL   Hemoglobin 13.2 12.0 - 15.0 g/dL   HCT 39.1 36.0 - 46.0 %   MCV 82.0 78.0 - 100.0 fL   MCH 27.7 26.0 - 34.0 pg   MCHC 33.8 30.0 - 36.0 g/dL   RDW 13.2 11.5 - 15.5 %   Platelets 222 150 - 400 K/uL   MPV 11.1 8.6 - 12.4 fL   Neutrophils Relative % 58 43 - 77 %   Neutro Abs 3.7 1.7 - 7.7 K/uL   Lymphocytes Relative 34 12 - 46 %   Lymphs Abs 2.2 0.7 - 4.0 K/uL   Monocytes Relative 5 3 - 12 %   Monocytes Absolute 0.3 0.1 - 1.0 K/uL   Eosinophils Relative 3 0 - 5 %   Eosinophils Absolute 0.2 0.0 - 0.7 K/uL   Basophils Relative 0 0 - 1 %   Basophils Absolute 0.0 0.0 - 0.1 K/uL   Smear Review Criteria for review not met   COMPLETE METABOLIC PANEL WITH GFR  Result Value Ref Range   Sodium 138 135 - 146 mmol/L   Potassium 3.8  3.5 - 5.3 mmol/L   Chloride 102 98 - 110 mmol/L   CO2 25 20 - 31 mmol/L   Glucose, Bld 127 (H) 65 - 99 mg/dL   BUN 13 7 - 25 mg/dL   Creat 0.66 0.50 - 1.10 mg/dL   Total Bilirubin 0.6 0.2 - 1.2 mg/dL   Alkaline Phosphatase 61 33 - 115 U/L   AST 14 10 - 30 U/L   ALT 12 6 - 29 U/L   Total Protein 7.9 6.1 - 8.1 g/dL   Albumin 4.7 3.6 - 5.1 g/dL   Calcium 9.5 8.6 - 10.2 mg/dL   GFR, Est African American >89 >=60 mL/min   GFR, Est Non African American >89 >=60 mL/min  TSH  Result Value Ref Range   TSH 0.591 0.350 - 4.500 uIU/mL  POCT urinalysis dipstick  Result Value Ref Range   Color, UA yellow yellow   Clarity, UA clear clear   Glucose, UA negative negative   Bilirubin, UA negative negative   Ketones, POC UA trace (5) (A) negative   Spec Grav, UA 1.010    Blood,  UA small (A) negative   pH, UA 6.0    Protein Ur, POC negative negative   Urobilinogen, UA 0.2    Nitrite, UA Negative Negative   Leukocytes, UA Trace (A) Negative  POCT urine pregnancy  Result Value Ref Range   Preg Test, Ur Negative Negative     Assessment: Acute viral gastroenteritis.  Nausea and vomiting, intractability of vomiting not specified, unspecified vomiting type - Plan: Insert peripheral IV, Orthostatic vital signs, ondansetron (ZOFRAN-ODT) disintegrating tablet 4 mg, POCT CBC, ondansetron (ZOFRAN-ODT) 8 MG disintegrating tablet  Diarrhea of infectious origin - Plan: Insert peripheral IV, Orthostatic vital signs, ondansetron (ZOFRAN-ODT) disintegrating tablet 4 mg, ondansetron (ZOFRAN-ODT) 8 MG disintegrating tablet  Signed, Carola Frost.D.

## 2015-07-06 NOTE — Patient Instructions (Addendum)
I've called in some Zofran. If you're not improving in the next 24 hours, please either go to the emergency room or return for further evaluation

## 2015-07-06 NOTE — Addendum Note (Signed)
Addended by: Thelma BargeICHARDSON, SHEKETIA D on: 07/06/2015 02:21 PM   Modules accepted: Orders

## 2015-07-22 ENCOUNTER — Telehealth: Payer: 59 | Admitting: Physician Assistant

## 2015-07-22 DIAGNOSIS — J309 Allergic rhinitis, unspecified: Secondary | ICD-10-CM

## 2015-07-22 MED ORDER — LEVOCETIRIZINE DIHYDROCHLORIDE 5 MG PO TABS: 5.0000 mg | ORAL_TABLET | Freq: Every evening | ORAL | Status: AC

## 2015-07-22 MED ORDER — FLUTICASONE PROPIONATE 50 MCG/ACT NA SUSP: 2.0000 | Freq: Every day | NASAL | Status: AC

## 2015-07-22 NOTE — Progress Notes (Signed)
E visit for Allergic Rhinitis We are sorry that you are not feeling well.  Her is how we plan to help!  Based on what you have shared with me it looks like you have Allergic Rhinitis.  Rhinitis is when a reaction occurs that causes nasal congestion, runny nose, sneezing, and itching.  Most types of rhinitis are caused by an inflammation and are associated with symptoms in the eyes ears or throat. There are several types of rhinitis.  The most common are acute rhinitis, which is usually caused by a viral illness, allergic or seasonal rhinitis, and nonallergic or year-round rhinitis.  Nasal allergies occur certain times of the year.  Allergic rhinitis is caused when allergens in the air trigger the release of histamine in the body.  Histamine causes itching, swelling, and fluid to build up in the fragile linings of the nasal passages, sinuses and eyelids.  An itchy nose and clear discharge are common.  I recommend the following over the counter treatments: Xyzal 5 mg take 1 tablet daily  I also would recommend a nasal spray: Flonase 2 sprays into each nostril once daily. Also get some saline nasal spray (I recommend Simply Saline) and use a couple of times each day to flush out your nasal passages.    HOME CARE:   You can use an over-the-counter saline nasal spray as needed  Avoid areas where there is heavy dust, mites, or molds  Stay indoors on windy days during the pollen season  Keep windows closed in home, at least in bedroom; use air conditioner.  Use high-efficiency house air filter  Keep windows closed in car, turn AC on re-circulate  Avoid playing out with dog during pollen season  GET HELP RIGHT AWAY IF:   If your symptoms do not improve within 10 days  You become short of breath  You develop yellow or green discharge from your nose for over 3 days  You have coughing fits  MAKE SURE YOU:   Understand these instructions  Will watch your condition  Will get help  right away if you are not doing well or get worse  Thank you for choosing an e-visit. Your e-visit answers were reviewed by a board certified advanced clinical practitioner to complete your personal care plan. Depending upon the condition, your plan could have included both over the counter or prescription medications. Please review your pharmacy choice. Be sure that the pharmacy you have chosen is open so that you can pick up your prescription now.  If there is a problem you may message your provider in MyChart to have the prescription routed to another pharmacy. Your safety is important to us. If you have drug allergies check your prescription carefully.  For the next 24 hours, you can use MyChart to ask questions about today's visit, request a non-urgent call back, or ask for a work or school excuse from your e-visit provider. You will get an email in the next two days asking about your experience. I hope that your e-visit has been valuable and will speed your recovery.

## 2015-07-22 NOTE — Addendum Note (Signed)
Addended by: Marcelline MatesMARTIN, Lindzy Rupert on: 07/22/2015 11:03 AM   Modules accepted: Orders

## 2015-08-03 ENCOUNTER — Telehealth: Payer: 59 | Admitting: Nurse Practitioner

## 2015-08-03 DIAGNOSIS — N760 Acute vaginitis: Secondary | ICD-10-CM

## 2015-08-03 DIAGNOSIS — A499 Bacterial infection, unspecified: Secondary | ICD-10-CM

## 2015-08-03 DIAGNOSIS — B9689 Other specified bacterial agents as the cause of diseases classified elsewhere: Secondary | ICD-10-CM

## 2015-08-03 MED ORDER — METRONIDAZOLE 500 MG PO TABS: 500.0000 mg | ORAL_TABLET | Freq: Two times a day (BID) | ORAL | Status: AC

## 2015-08-03 NOTE — Progress Notes (Signed)

## 2015-08-23 ENCOUNTER — Encounter (HOSPITAL_BASED_OUTPATIENT_CLINIC_OR_DEPARTMENT_OTHER): Payer: Self-pay | Admitting: *Deleted

## 2015-08-23 ENCOUNTER — Emergency Department (HOSPITAL_BASED_OUTPATIENT_CLINIC_OR_DEPARTMENT_OTHER)
Admission: EM | Admit: 2015-08-23 | Discharge: 2015-08-23 | Disposition: A | Discharge status: Home / Self Care | Payer: 59 | Attending: Emergency Medicine | Admitting: Emergency Medicine

## 2015-08-23 ENCOUNTER — Emergency Department (HOSPITAL_BASED_OUTPATIENT_CLINIC_OR_DEPARTMENT_OTHER): Payer: 59

## 2015-08-23 DIAGNOSIS — S61233A Puncture wound without foreign body of left middle finger without damage to nail, initial encounter: Secondary | ICD-10-CM | POA: Diagnosis not present

## 2015-08-23 DIAGNOSIS — S60132A Contusion of left middle finger with damage to nail, initial encounter: Secondary | ICD-10-CM | POA: Diagnosis not present

## 2015-08-23 DIAGNOSIS — Z23 Encounter for immunization: Secondary | ICD-10-CM | POA: Insufficient documentation

## 2015-08-23 DIAGNOSIS — S6010XA Contusion of unspecified finger with damage to nail, initial encounter: Secondary | ICD-10-CM

## 2015-08-23 DIAGNOSIS — M79645 Pain in left finger(s): Secondary | ICD-10-CM

## 2015-08-23 DIAGNOSIS — W230XXA Caught, crushed, jammed, or pinched between moving objects, initial encounter: Secondary | ICD-10-CM | POA: Diagnosis not present

## 2015-08-23 DIAGNOSIS — S60032A Contusion of left middle finger without damage to nail, initial encounter: Secondary | ICD-10-CM | POA: Diagnosis not present

## 2015-08-23 DIAGNOSIS — Y939 Activity, unspecified: Secondary | ICD-10-CM | POA: Insufficient documentation

## 2015-08-23 DIAGNOSIS — Y929 Unspecified place or not applicable: Secondary | ICD-10-CM | POA: Diagnosis not present

## 2015-08-23 DIAGNOSIS — Y999 Unspecified external cause status: Secondary | ICD-10-CM | POA: Diagnosis not present

## 2015-08-23 DIAGNOSIS — S6992XA Unspecified injury of left wrist, hand and finger(s), initial encounter: Secondary | ICD-10-CM | POA: Diagnosis not present

## 2015-08-23 MED ORDER — TETANUS-DIPHTH-ACELL PERTUSSIS 5-2.5-18.5 LF-MCG/0.5 IM SUSP
0.5000 mL | Freq: Once | INTRAMUSCULAR | Status: AC
  Administered 2015-08-23: 0.5 mL via INTRAMUSCULAR
  Filled 2015-08-23: qty 0.5

## 2015-08-23 MED ORDER — IBUPROFEN 200 MG PO TABS
600.0000 mg | ORAL_TABLET | Freq: Once | ORAL | Status: AC
  Administered 2015-08-23: 600 mg via ORAL
  Filled 2015-08-23: qty 1

## 2015-08-23 MED ORDER — SULFAMETHOXAZOLE-TRIMETHOPRIM 800-160 MG PO TABS
1.0000 | ORAL_TABLET | Freq: Once | ORAL | Status: AC
  Administered 2015-08-23: 1 via ORAL
  Filled 2015-08-23: qty 1

## 2015-08-23 MED ORDER — SULFAMETHOXAZOLE-TRIMETHOPRIM 800-160 MG PO TABS: 1.0000 | ORAL_TABLET | Freq: Two times a day (BID) | ORAL | Status: AC

## 2015-08-23 NOTE — ED Provider Notes (Signed)
CSN: [id number]     Arrival date & time 08/23/15  0105 History   First MD Initiated Contact with Patient 08/23/15 0149     Chief Complaint  Patient presents with  . Finger Injury      The history is provided by the patient.   Patient reports that she slammed her left middle finger in a car door yesterday and presents with a subungual hematoma to the distal left middle finger and complains of pain in this region.  She also believes that there was a small puncture wound on the volar aspect of the left middle finger and is having some mild pain without erythema in this region.  She is able to flex her left DIP and PIP joint of the left little finger without difficulty.  Her pain is 10 out 10 at this time   Past Medical History  Diagnosis Date  . Allergy    History reviewed. No pertinent past surgical history. Family History  Problem Relation Age of Onset  . Hypertension Mother   . Hypertension Sister   . Hypertension Brother   . Stroke Maternal Grandmother   . Hyperlipidemia Maternal Grandfather   . Hypertension Maternal Grandfather   . Stroke Maternal Grandfather   . Diabetes Paternal Grandmother   . Cancer Paternal Grandfather    Social History  Substance Use Topics  . Smoking status: Never Smoker   . Smokeless tobacco: Never Used  . Alcohol Use: 0.0 oz/week    0 Standard drinks or equivalent per week     Comment: "maybe once a week, maybe"   OB History    No data available     Review of Systems  All other systems reviewed and are negative.     Allergies  Review of patient's allergies indicates no known allergies.  Home Medications   Prior to Admission medications   Medication Sig Start Date End Date Taking? Authorizing Provider  acetaminophen (TYLENOL) 500 MG tablet Take 1 tablet (500 mg total) by mouth every 8 (eight) hours as needed for moderate pain. Patient not taking: Reported on 05/05/2015 02/12/15   Ofilia NeasMichael L Clark, PA-C  fluticasone [facility](FLONASE) 50 MCG/ACT  nasal spray Place 2 sprays into both nostrils daily. 07/22/15   Waldon MerlWilliam C Martin, PA-C  levocetirizine (XYZAL) 5 MG tablet Take 1 tablet (5 mg total) by mouth every evening. 07/22/15   Waldon MerlWilliam C Martin, PA-C  metroNIDAZOLE (FLAGYL) 500 MG tablet Take 1 tablet (500 mg total) by mouth 2 (two) times daily. 08/03/15   Mary-Margaret Daphine DeutscherMartin, FNP  ondansetron (ZOFRAN-ODT) 8 MG disintegrating tablet Take 1 tablet (8 mg total) by mouth every 8 (eight) hours as needed for nausea. 07/06/15   Elvina SidleKurt Lauenstein, MD  sertraline (ZOLOFT) 50 MG tablet Take 50 mg by mouth daily.    Historical Provider, MD  sulfamethoxazole-trimethoprim (BACTRIM DS,SEPTRA DS) 800-160 MG tablet Take 1 tablet by mouth 2 (two) times daily. 08/23/15 08/30/15  Azalia BilisKevin Temika Sutphin, MD   BP 128/94 mmHg  Pulse 82  Temp(Src) 98.4 F (36.9 C) (Oral)  Resp 20  Ht 5\' 5"  (1.651 m)  Wt 146 lb (66.225 kg)  BMI 24.30 kg/m2  SpO2 98%  LMP 08/08/2015 (Approximate) Physical Exam  Constitutional: She is oriented to person, place, and time. She appears well-developed and well-nourished.  HENT:  Head: Normocephalic.  Eyes: EOM are normal.  Neck: Normal range of motion.  Pulmonary/Chest: Effort normal.  Abdominal: She exhibits no distension.  Musculoskeletal: Normal range of motion.  Subungual hematoma noted  to the left middle finger.  Small puncture wound on the radial aspect of the distal left middle finger on the volar side.  No drainage of left middle finger or erythema or significant swelling noted  Neurological: She is alert and oriented to person, place, and time.  Psychiatric: She has a normal mood and affect.  Nursing note and vitals reviewed.   ED Course  Procedures (including critical care time)   +++++++++++++++++++++++++++++++++++++++++++++++++++++++++++++++++  Trephination of Subungual Hematoma Performed by: Lyanne Co Consent: Verbal consent obtained. Risks and benefits: risks, benefits and alternatives were discussed Consent  given by: patient Required items: required blood products, implants, devices, and special equipment available Patient identity confirmed: verbally with patient Time out: Immediately prior to procedure a "time out" was called to verify the correct patient, procedure, equipment, support staff and site/side marked as required. Indications: Subungual Hematoma  Location: Left Middle finger Local anesthesia used: no Preparation: Patient was prepped and draped in the usual sterile fashion. Electrocautery used Patient tolerance: Patient tolerated the procedure well with no immediate complications.  ++++++++++++++++++++++++++++++++++++++++++++++++++++++++++    Labs Review Labs Reviewed - No data to display  Imaging Review Dg Finger Middle Left  08/23/2015  CLINICAL DATA:  Shut third digit in car door. EXAM: LEFT MIDDLE FINGER 2+V COMPARISON:  None. FINDINGS: There is no evidence of fracture or dislocation. There is no evidence of arthropathy or other focal bone abnormality. Soft tissues are unremarkable. IMPRESSION: Negative. Electronically Signed   By: Awilda Metro M.D.   On: 08/23/2015 01:48   I have personally reviewed and evaluated these images and lab results as part of my medical decision-making.   EKG Interpretation None      MDM   Final diagnoses:  Subungual hematoma of digit of hand, initial encounter  Pain in finger of left hand    Patient feels much better after trephination of her subungual hematoma.  She does have a small puncture wound to the volar aspect of the distal left middle finger.  No drainage or significant erythema but she is at risk for infection.  She'll be started on Bactrim.  Infection warnings given.  Discharge home in good condition.    Azalia Bilis, MD 08/23/15 [phone]

## 2015-08-23 NOTE — Discharge Instructions (Signed)
Subungual Hematoma A subungual hematoma is a pocket of blood that collects under the fingernail or toenail. The pressure created by the blood under the nail can cause pain. CAUSES  A subungual hematoma occurs when an injury to the finger or toe causes a blood vessel beneath the nail to break. The injury can occur from a direct blow such as slamming a finger in a door. It can also occur from a repeated injury such as pressure on the foot in a shoe while running. A subungual hematoma is sometimes called runner's toe or tennis toe. SYMPTOMS   Blue or dark blue skin under the nail.  Pain or throbbing in the injured area. DIAGNOSIS  Your caregiver can determine whether you have a subungual hematoma based on your history and a physical exam. If your caregiver thinks you might have a broken (fractured) bone, X-rays may be taken. TREATMENT  Hematomas usually go away on their own over time. Your caregiver may make a hole in the nail to drain the blood. Draining the blood is painless and usually provides significant relief from pain and throbbing. The nail usually grows back normally after this procedure. In some cases, the nail may need to be removed. This is done if there is a cut under the nail that requires stitches (sutures). HOME CARE INSTRUCTIONS   Put ice on the injured area.  Put ice in a plastic bag.  Place a towel between your skin and the bag.  Leave the ice on for 15-20 minutes, 03-04 times a day for the first 1 to 2 days.  Elevate the injured area to help decrease pain and swelling.  If you were given a bandage, wear it for as long as directed by your caregiver.  If part of your nail falls off, trim the remaining nail gently. This prevents the nail from catching on something and causing further injury.  Only take over-the-counter or prescription medicines for pain, discomfort, or fever as directed by your caregiver. SEEK IMMEDIATE MEDICAL CARE IF:   You have redness or swelling  around the nail.  You have yellowish-white fluid (pus) coming from the nail.  Your pain is not controlled with medicine.  You have a fever. MAKE SURE YOU:  Understand these instructions.  Will watch your condition.  Will get help right away if you are not doing well or get worse.   This information is not intended to replace advice given to you by your health care provider. Make sure you discuss any questions you have with your health care provider.   Document Released: 03/24/2000 Document Revised: 06/19/2011 Document Reviewed: 08/12/2014 Elsevier Interactive Patient Education 2016 [employer]  

## 2015-08-23 NOTE — ED Notes (Addendum)
Slammed finger in car door yesterday at 8am, c/o pain, swelling, bruising, denies other sx. "Took a sisters pain med at 1800, a codeine type med w/o relief", rates pain 10/10. Nail torn, puncture to pad, bruising under nail and noted to pad, and swelling under cuticle.

## 2015-08-23 NOTE — ED Notes (Signed)
Finger cleaned with chlorahexadine wipe/ swab. Ambulatory to xray with steady gait.

## 2015-08-24 ENCOUNTER — Emergency Department (HOSPITAL_BASED_OUTPATIENT_CLINIC_OR_DEPARTMENT_OTHER)
Admission: EM | Admit: 2015-08-24 | Discharge: 2015-08-24 | Disposition: A | Discharge status: Home / Self Care | Payer: 59 | Attending: Emergency Medicine | Admitting: Emergency Medicine

## 2015-08-24 ENCOUNTER — Encounter (HOSPITAL_BASED_OUTPATIENT_CLINIC_OR_DEPARTMENT_OTHER): Payer: Self-pay | Admitting: Emergency Medicine

## 2015-08-24 DIAGNOSIS — S6992XS Unspecified injury of left wrist, hand and finger(s), sequela: Secondary | ICD-10-CM | POA: Diagnosis not present

## 2015-08-24 DIAGNOSIS — Y939 Activity, unspecified: Secondary | ICD-10-CM | POA: Insufficient documentation

## 2015-08-24 DIAGNOSIS — W230XXS Caught, crushed, jammed, or pinched between moving objects, sequela: Secondary | ICD-10-CM | POA: Diagnosis not present

## 2015-08-24 DIAGNOSIS — Y92810 Car as the place of occurrence of the external cause: Secondary | ICD-10-CM | POA: Insufficient documentation

## 2015-08-24 DIAGNOSIS — Y999 Unspecified external cause status: Secondary | ICD-10-CM | POA: Insufficient documentation

## 2015-08-24 MED ORDER — NAPROXEN 250 MG PO TABS
500.0000 mg | ORAL_TABLET | Freq: Once | ORAL | Status: AC
  Administered 2015-08-24: 500 mg via ORAL
  Filled 2015-08-24: qty 2

## 2015-08-24 MED ORDER — DICLOFENAC SODIUM ER 100 MG PO TB24: 100.0000 mg | ORAL_TABLET | Freq: Every day | ORAL | Status: AC

## 2015-08-24 NOTE — ED Notes (Signed)
Pt jammed finger in car door on Sunday night.  Seen here and got finger drained but it has filled up again.

## 2015-08-24 NOTE — ED Provider Notes (Signed)
CSN: [id number]     Arrival date & time 08/24/15  0128 History   First MD Initiated Contact with Patient 08/24/15 0151     Chief Complaint  Patient presents with  . Finger Injury     (Consider location/radiation/quality/duration/timing/severity/associated sxs/prior Treatment) Patient is a 30 y.o. female presenting with hand pain. The history is provided by the patient.  Hand Pain This is a new problem. The current episode started 2 days ago. The problem occurs constantly. The problem has not changed since onset.Pertinent negatives include no chest pain, no abdominal pain and no headaches. Nothing aggravates the symptoms. Nothing relieves the symptoms. The treatment provided no relief.  Trephination yesterday in the ED.  No icing or elevating it.  Started abx.  Still having pain  Past Medical History  Diagnosis Date  . Allergy    History reviewed. No pertinent past surgical history. Family History  Problem Relation Age of Onset  . Hypertension Mother   . Hypertension Sister   . Hypertension Brother   . Stroke Maternal Grandmother   . Hyperlipidemia Maternal Grandfather   . Hypertension Maternal Grandfather   . Stroke Maternal Grandfather   . Diabetes Paternal Grandmother   . Cancer Paternal Grandfather    Social History  Substance Use Topics  . Smoking status: Never Smoker   . Smokeless tobacco: Never Used  . Alcohol Use: 0.0 oz/week    0 Standard drinks or equivalent per week     Comment: "maybe once a week, maybe"   OB History    No data available     Review of Systems  Cardiovascular: Negative for chest pain.  Gastrointestinal: Negative for abdominal pain.  Neurological: Negative for headaches.  All other systems reviewed and are negative.     Allergies  Review of patient's allergies indicates no known allergies.  Home Medications   Prior to Admission medications   Medication Sig Start Date End Date Taking? Authorizing Provider  acetaminophen (TYLENOL)  500 MG tablet Take 1 tablet (500 mg total) by mouth every 8 (eight) hours as needed for moderate pain. Patient not taking: Reported on 05/05/2015 02/12/15   Ofilia NeasMichael L Clark, PA-C  fluticasone [facility](FLONASE) 50 MCG/ACT nasal spray Place 2 sprays into both nostrils daily. 07/22/15   Waldon MerlWilliam C Martin, PA-C  levocetirizine (XYZAL) 5 MG tablet Take 1 tablet (5 mg total) by mouth every evening. 07/22/15   Waldon MerlWilliam C Martin, PA-C  metroNIDAZOLE (FLAGYL) 500 MG tablet Take 1 tablet (500 mg total) by mouth 2 (two) times daily. 08/03/15   Mary-Margaret Daphine DeutscherMartin, FNP  ondansetron (ZOFRAN-ODT) 8 MG disintegrating tablet Take 1 tablet (8 mg total) by mouth every 8 (eight) hours as needed for nausea. 07/06/15   Elvina SidleKurt Lauenstein, MD  sertraline (ZOLOFT) 50 MG tablet Take 50 mg by mouth daily.    Historical Provider, MD  sulfamethoxazole-trimethoprim (BACTRIM DS,SEPTRA DS) 800-160 MG tablet Take 1 tablet by mouth 2 (two) times daily. 08/23/15 08/30/15  Azalia BilisKevin Campos, MD   BP 139/90 mmHg  Pulse 93  Temp(Src) 98.6 F (37 C) (Oral)  Resp 18  Ht 5\' 5"  (1.651 m)  Wt 146 lb (66.225 kg)  BMI 24.30 kg/m2  SpO2 100%  LMP 08/08/2015 (Approximate) Physical Exam  Constitutional: She is oriented to person, place, and time. She appears well-developed and well-nourished. No distress.  HENT:  Head: Normocephalic and atraumatic.  Mouth/Throat: Oropharynx is clear and moist.  Eyes: Pupils are equal, round, and reactive to light.  Neck: Normal range of motion. Neck  supple.  Cardiovascular: Normal rate, regular rhythm and intact distal pulses.   Pulmonary/Chest: Effort normal and breath sounds normal. She has no wheezes. She has no rales.  Abdominal: Soft. Bowel sounds are normal. There is no tenderness. There is no rebound and no guarding.  Musculoskeletal: Normal range of motion.       Left hand: Normal sensation noted. Normal strength noted.       Hands: Neurological: She is alert and oriented to person, place, and time.  Skin: Skin  is warm and dry. No rash noted. No erythema.  Psychiatric: She has a normal mood and affect.    ED Course  Procedures (including critical care time) Labs Review Labs Reviewed - No data to display  Imaging Review Dg Finger Middle Left  08/23/2015  CLINICAL DATA:  Shut third digit in car door. EXAM: LEFT MIDDLE FINGER 2+V COMPARISON:  None. FINDINGS: There is no evidence of fracture or dislocation. There is no evidence of arthropathy or other focal bone abnormality. Soft tissues are unremarkable. IMPRESSION: Negative. Electronically Signed   By: Awilda Metro M.D.   On: 08/23/2015 01:48   I have personally reviewed and evaluated these images and lab results as part of my medical decision-making.   EKG Interpretation None      MDM   Final diagnoses:  None    Filed Vitals:   08/24/15 0131  BP: 139/90  Pulse: 93  Temp: 98.6 F (37 C)  Resp: 18   No signs of infection at this time.  On bactrim.    Alternate tylenol and NSAID.  She has not been icing the finger nor has she been elevating it.  It has been some time since taking any ibuprofern.  Ice 20 minutes every 2 hours.  Elevate hand when not in use.  PRN follow up with hand surgery.  There is no indication for repeat trephination at this time.  Will splint for comfort.  Will prescribe voltaren for pain.       Cy Blamer, MD 08/24/15 [phone]

## 2015-08-28 ENCOUNTER — Emergency Department (HOSPITAL_COMMUNITY)
Admission: EM | Admit: 2015-08-28 | Discharge: 2015-08-28 | Disposition: A | Discharge status: Home / Self Care | Payer: 59 | Attending: Emergency Medicine | Admitting: Emergency Medicine

## 2015-08-28 ENCOUNTER — Encounter (HOSPITAL_COMMUNITY): Payer: Self-pay

## 2015-08-28 DIAGNOSIS — Y999 Unspecified external cause status: Secondary | ICD-10-CM | POA: Insufficient documentation

## 2015-08-28 DIAGNOSIS — W231XXA Caught, crushed, jammed, or pinched between stationary objects, initial encounter: Secondary | ICD-10-CM | POA: Diagnosis not present

## 2015-08-28 DIAGNOSIS — Y939 Activity, unspecified: Secondary | ICD-10-CM | POA: Diagnosis not present

## 2015-08-28 DIAGNOSIS — M79645 Pain in left finger(s): Secondary | ICD-10-CM | POA: Diagnosis not present

## 2015-08-28 DIAGNOSIS — S6992XA Unspecified injury of left wrist, hand and finger(s), initial encounter: Secondary | ICD-10-CM | POA: Diagnosis present

## 2015-08-28 DIAGNOSIS — S60032A Contusion of left middle finger without damage to nail, initial encounter: Secondary | ICD-10-CM | POA: Diagnosis not present

## 2015-08-28 DIAGNOSIS — Y929 Unspecified place or not applicable: Secondary | ICD-10-CM | POA: Insufficient documentation

## 2015-08-28 MED ORDER — LIDOCAINE HCL 2 % IJ SOLN
10.0000 mL | Freq: Once | INTRAMUSCULAR | Status: AC
  Administered 2015-08-28: 200 mg via INTRADERMAL
  Filled 2015-08-28: qty 20

## 2015-08-28 MED ORDER — DOXYCYCLINE HYCLATE 100 MG PO CAPS: 100.0000 mg | ORAL_CAPSULE | Freq: Two times a day (BID) | ORAL | Status: AC

## 2015-08-28 NOTE — Discharge Instructions (Signed)
Pamela Davidson,  Nice meeting you! Please follow-up with hand surgery if needed. Return to the emergency department if you develop fevers, chills, increased pain, yellow/green drainage, new/worsening symptoms. Feel better soon!  S. Lane HackerNicole Dineen Conradt, PA-C

## 2015-08-28 NOTE — ED Notes (Signed)
Declined W/C at D/C and was escorted to lobby by RN. 

## 2015-08-28 NOTE — ED Notes (Signed)
Patient here with left hand middle finger pain after shutting same in car door last Sunday. Area around nailbed swollen and painful. Has been taking antibiotics for same

## 2015-08-28 NOTE — ED Provider Notes (Addendum)
CSN: [id number]     Arrival date & time 08/28/15  1719 History   First MD Initiated Contact with Patient 08/28/15 1728     Chief Complaint  Patient presents with  . finger pain    HPI   Pamela Davidson is a 30 y.o. female presenting with finger pain. She has been seen in the ED twice for the same complaint, had a trephination for subungual hematoma on 08/23/15 and was started on bactrim. She states the pain has become increasingly worse. She denies fevers, chills, N/V, numbness/tingling, inability to move her finger.      Past Medical History  Diagnosis Date  . Allergy    History reviewed. No pertinent past surgical history. Family History  Problem Relation Age of Onset  . Hypertension Mother   . Hypertension Sister   . Hypertension Brother   . Stroke Maternal Grandmother   . Hyperlipidemia Maternal Grandfather   . Hypertension Maternal Grandfather   . Stroke Maternal Grandfather   . Diabetes Paternal Grandmother   . Cancer Paternal Grandfather    Social History  Substance Use Topics  . Smoking status: Never Smoker   . Smokeless tobacco: Never Used  . Alcohol Use: 0.0 oz/week    0 Standard drinks or equivalent per week     Comment: "maybe once a week, maybe"   OB History    No data available     Review of Systems  Ten systems are reviewed and are negative for acute change except as noted in the HPI   Allergies  Review of patient's allergies indicates no known allergies.  Home Medications   Prior to Admission medications   Medication Sig Start Date End Date Taking? Authorizing Provider  acetaminophen (TYLENOL) 500 MG tablet Take 1 tablet (500 mg total) by mouth every 8 (eight) hours as needed for moderate pain. Patient not taking: Reported on 05/05/2015 02/12/15   Ofilia NeasMichael L Clark, PA-C  Diclofenac Sodium CR (VOLTAREN-XR) 100 MG 24 hr tablet Take 1 tablet (100 mg total) by mouth daily. 08/24/15   April Palumbo, MD  fluticasone [facility](FLONASE) 50 MCG/ACT nasal spray Place 2  sprays into both nostrils daily. 07/22/15   Waldon MerlWilliam C Martin, PA-C  levocetirizine (XYZAL) 5 MG tablet Take 1 tablet (5 mg total) by mouth every evening. 07/22/15   Waldon MerlWilliam C Martin, PA-C  metroNIDAZOLE (FLAGYL) 500 MG tablet Take 1 tablet (500 mg total) by mouth 2 (two) times daily. 08/03/15   Mary-Margaret Daphine DeutscherMartin, FNP  ondansetron (ZOFRAN-ODT) 8 MG disintegrating tablet Take 1 tablet (8 mg total) by mouth every 8 (eight) hours as needed for nausea. 07/06/15   Elvina SidleKurt Lauenstein, MD  sertraline (ZOLOFT) 50 MG tablet Take 50 mg by mouth daily.    Historical Provider, MD  sulfamethoxazole-trimethoprim (BACTRIM DS,SEPTRA DS) 800-160 MG tablet Take 1 tablet by mouth 2 (two) times daily. 08/23/15 08/30/15  Azalia BilisKevin Campos, MD   BP 126/78 mmHg  Pulse 93  Temp(Src) 98.4 F (36.9 C) (Oral)  Resp 18  SpO2 100%  LMP 08/08/2015 (Approximate) Physical Exam  Constitutional: She appears well-developed and well-nourished. No distress.  HENT:  Head: Normocephalic and atraumatic.  Eyes: Conjunctivae are normal. Right eye exhibits no discharge. Left eye exhibits no discharge. No scleral icterus.  Neck: No tracheal deviation present.  Cardiovascular: Normal rate and intact distal pulses.   Pulmonary/Chest: Effort normal and breath sounds normal. No respiratory distress.  Abdominal: Soft. Bowel sounds are normal. She exhibits no distension.  Musculoskeletal: Normal range of motion. She  exhibits edema and tenderness.  Left middle finger with subungual ecchymosis and minimal erythema at cuticle. See photo. NVI BL.   Lymphadenopathy:    She has no cervical adenopathy.  Neurological: She is alert. Coordination normal.  Skin: Skin is warm and dry. No rash noted. She is not diaphoretic. No erythema.  Psychiatric: She has a normal mood and affect. Her behavior is normal.  Nursing note and vitals reviewed.          ED Course  .Marland KitchenIncision and Drainage Date/Time: 08/28/2015 1:40 PM Performed by: Althea Grimmer  NICOLE Authorized by: Melton Krebs Consent: Verbal consent obtained. Consent given by: patient Patient identity confirmed: verbally with patient Type: abscess Body area: upper extremity Location details: left long finger Anesthesia: digital block Local anesthetic: lidocaine 2% without epinephrine Scalpel size: 11 Incision type: single straight Drainage: bloody Drainage amount: scant Wound treatment: wound left open Packing material: none Patient tolerance: Patient tolerated the procedure well with no immediate complications  Irrigation Date/Time: 08/28/2015 1:46 PM Performed by: Althea Grimmer NICOLE Authorized by: Althea Grimmer NICOLE Consent: Verbal consent obtained. Consent given by: patient Patient identity confirmed: verbally with patient Local anesthesia used: yes Anesthesia: digital block Local anesthetic: lidocaine 2% without epinephrine Patient tolerance: Patient tolerated the procedure well with no immediate complications Comments: Pressure irrigation (60 cc syringe and combiguard) with 1 liter normal saline    MDM   Final diagnoses:  Finger pain, left   Patient nontoxic appearing, VSS. Abx changed to doxycycline. I&D without purulent drainage. Irrigation performed. Artificial nail removed. Patient feels improved. Patient may be safely discharged home. Discussed reasons for return. Patient to follow-up with hand surgery as needed. Patient in understanding and agreement with the plan.  Case discussed with Dr. Adriana Simas who agrees with above plan.   Melton Krebs, PA-C 09/07/15 1349  Donnetta Hutching, MD 09/16/15 0705  Melton Krebs, PA-C 09/23/15 4034  Donnetta Hutching, MD 09/25/15 [phone]

## 2015-09-17 ENCOUNTER — Telehealth: Payer: 59 | Admitting: Family

## 2015-09-17 DIAGNOSIS — B9689 Other specified bacterial agents as the cause of diseases classified elsewhere: Secondary | ICD-10-CM

## 2015-09-17 DIAGNOSIS — J329 Chronic sinusitis, unspecified: Secondary | ICD-10-CM | POA: Diagnosis not present

## 2015-09-17 DIAGNOSIS — A499 Bacterial infection, unspecified: Secondary | ICD-10-CM

## 2015-09-17 MED ORDER — AZITHROMYCIN 250 MG PO TABS: ORAL_TABLET | ORAL | Status: AC

## 2015-09-17 NOTE — Progress Notes (Signed)
We are sorry that you are not feeling well.  Here is how we plan to help!  Based on what you have shared with me it looks like you have sinusitis.  Sinusitis is inflammation and infection in the sinus cavities of the head.  Based on your presentation I believe you most likely have Acute Bacterial Sinusitis.  This is an infection caused by bacteria and is treated with antibiotics. I have prescribed Zpak as directed. You will take 2 tabs today and 1 tab a day x 4 more days. You may use an oral decongestant such as Mucinex D or if you have glaucoma or high blood pressure use plain Mucinex. Saline nasal spray help and can safely be used as often as needed for congestion.  If you develop worsening sinus pain, fever or notice severe headache and vision changes, or if symptoms are not better after completion of antibiotic, please schedule an appointment with a health care provider.    Sinus infections are not as easily transmitted as other respiratory infection, however we still recommend that you avoid close contact with loved ones, especially the very young and elderly.  Remember to wash your hands thoroughly throughout the day as this is the number one way to prevent the spread of infection!  Home Care:  Only take medications as instructed by your medical team.  Complete the entire course of an antibiotic.  Do not take these medications with alcohol.  A steam or ultrasonic humidifier can help congestion.  You can place a towel over your head and breathe in the steam from hot water coming from a faucet.  Avoid close contacts especially the very young and the elderly.  Cover your mouth when you cough or sneeze.  Always remember to wash your hands.  Get Help Right Away If:  You develop worsening fever or sinus pain.  You develop a severe head ache or visual changes.  Your symptoms persist after you have completed your treatment plan.  Make sure you  Understand these instructions.  Will  watch your condition.  Will get help right away if you are not doing well or get worse.  Your e-visit answers were reviewed by a board certified advanced clinical practitioner to complete your personal care plan.  Depending on the condition, your plan could have included both over the counter or prescription medications.  If there is a problem please reply  once you have received a response from your provider.  Your safety is important to us.  If you have drug allergies check your prescription carefully.    You can use MyChart to ask questions about today's visit, request a non-urgent call back, or ask for a work or school excuse for 24 hours related to this e-Visit. If it has been greater than 24 hours you will need to follow up with your provider, or enter a new e-Visit to address those concerns.  You will get an e-mail in the next two days asking about your experience.  I hope that your e-visit has been valuable and will speed your recovery. Thank you for using e-visits.

## 2015-09-19 ENCOUNTER — Other Ambulatory Visit: Payer: Self-pay | Admitting: Physician Assistant

## 2015-12-20 DIAGNOSIS — G43909 Migraine, unspecified, not intractable, without status migrainosus: Secondary | ICD-10-CM | POA: Diagnosis not present

## 2016-06-07 DIAGNOSIS — Z3040 Encounter for surveillance of contraceptives, unspecified: Secondary | ICD-10-CM | POA: Diagnosis not present

## 2017-01-24 IMAGING — CR DG FINGER MIDDLE 2+V*L*
3 series · 3 of 3 positions shown · non-contrast
Comparison: None.

CLINICAL DATA: Shut third digit in car door.

EXAM:
LEFT MIDDLE FINGER 2+V

[x finger pa left]
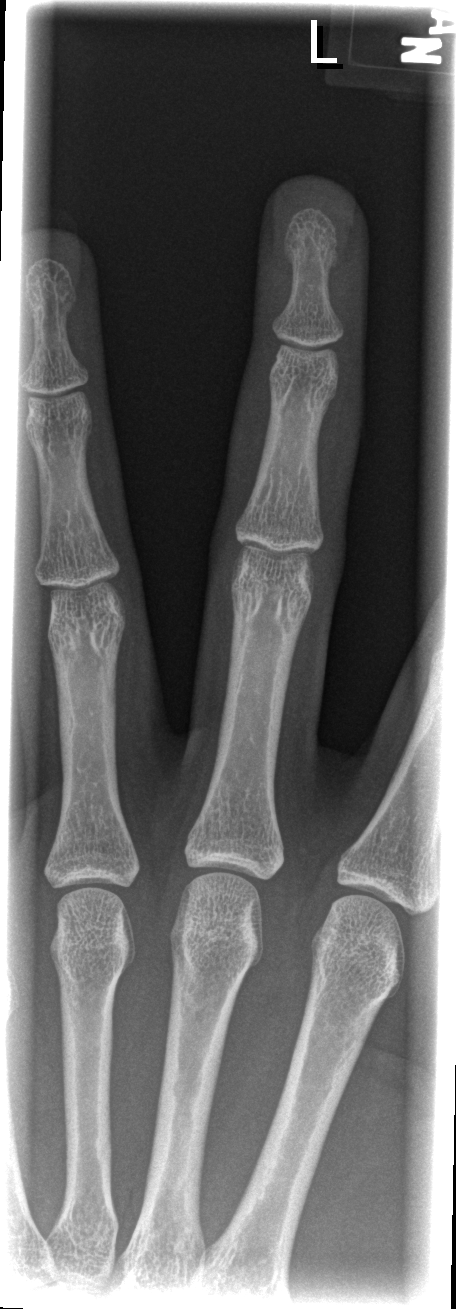

[x finger obl. left]
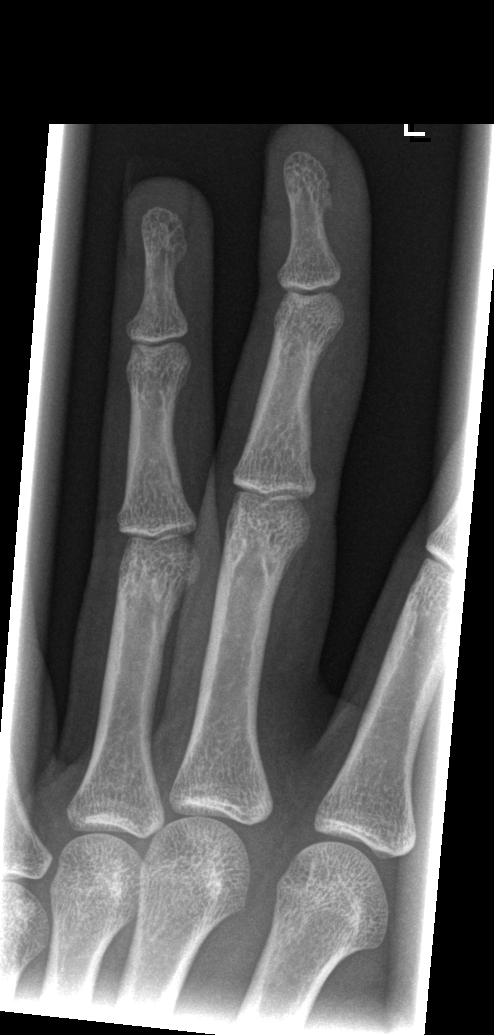

[x finger lateral left]
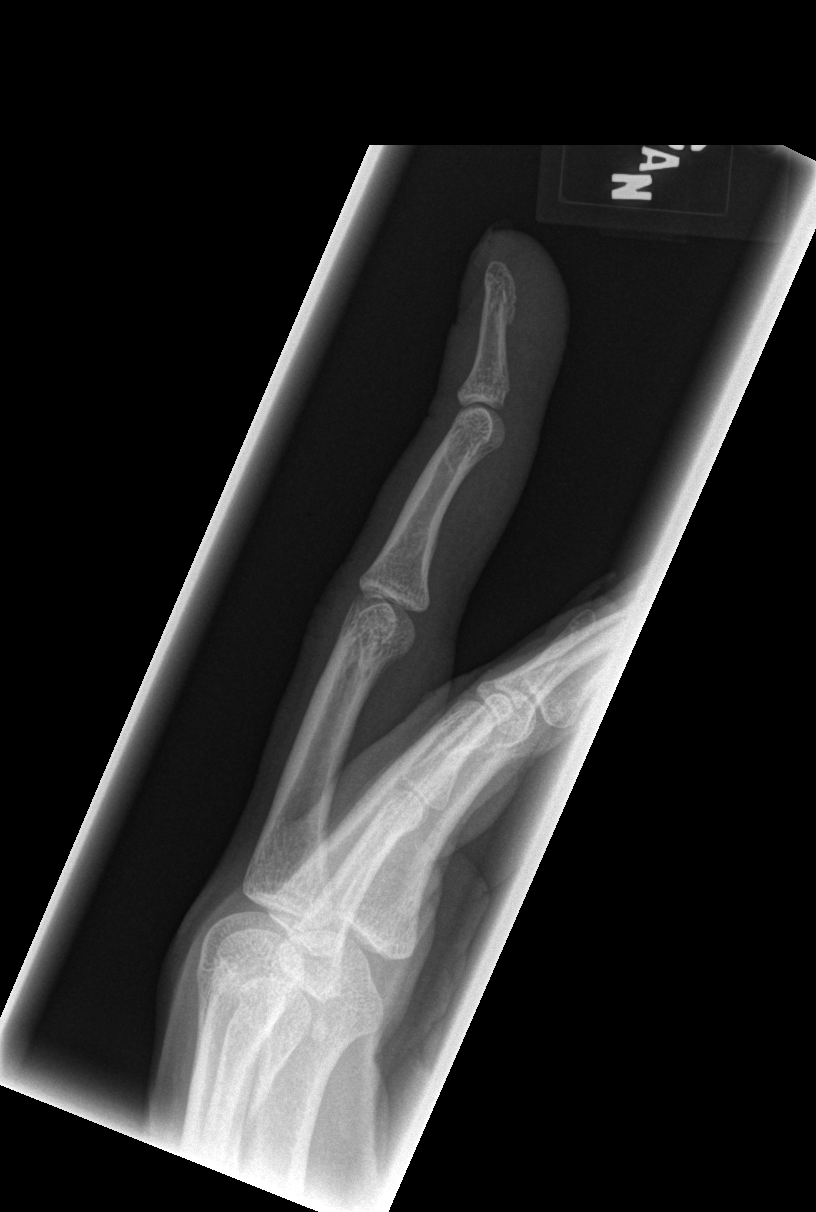

[3 of 3 positions shown; findings below may reference images not displayed]

FINDINGS: There is no evidence of fracture or dislocation. There is no
evidence of arthropathy or other focal bone abnormality. Soft
tissues are unremarkable.
IMPRESSION: Negative.

## 2024-02-08 ENCOUNTER — Ambulatory Visit: Admitting: General Practice
